# Patient Record
Sex: Male | Born: 1990 | Race: White | Hispanic: No | Marital: Single | Smoking: Current every day smoker
Health system: Southern US, Community
[De-identification: ages and names within clinical notes are randomized; demographics above are authoritative.]

## PROBLEM LIST (undated history)

## (undated) DIAGNOSIS — J45909 Unspecified asthma, uncomplicated: Secondary | ICD-10-CM

## (undated) DIAGNOSIS — M1990 Unspecified osteoarthritis, unspecified site: Secondary | ICD-10-CM

---

## 2012-10-20 ENCOUNTER — Encounter (HOSPITAL_COMMUNITY): Payer: Self-pay | Admitting: Physical Medicine and Rehabilitation

## 2012-10-20 ENCOUNTER — Emergency Department (HOSPITAL_COMMUNITY)
Admission: EM | Admit: 2012-10-20 | Discharge: 2012-10-20 | Disposition: A | Discharge status: Home / Self Care | Payer: Self-pay | Attending: Emergency Medicine | Admitting: Emergency Medicine

## 2012-10-20 DIAGNOSIS — S61209A Unspecified open wound of unspecified finger without damage to nail, initial encounter: Secondary | ICD-10-CM | POA: Insufficient documentation

## 2012-10-20 DIAGNOSIS — Y9389 Activity, other specified: Secondary | ICD-10-CM | POA: Insufficient documentation

## 2012-10-20 DIAGNOSIS — Z8739 Personal history of other diseases of the musculoskeletal system and connective tissue: Secondary | ICD-10-CM | POA: Insufficient documentation

## 2012-10-20 DIAGNOSIS — S61219A Laceration without foreign body of unspecified finger without damage to nail, initial encounter: Secondary | ICD-10-CM

## 2012-10-20 DIAGNOSIS — Y929 Unspecified place or not applicable: Secondary | ICD-10-CM | POA: Insufficient documentation

## 2012-10-20 DIAGNOSIS — W268XXA Contact with other sharp object(s), not elsewhere classified, initial encounter: Secondary | ICD-10-CM | POA: Insufficient documentation

## 2012-10-20 HISTORY — DX: Unspecified osteoarthritis, unspecified site: M19.90

## 2012-10-20 MED ORDER — TRAMADOL HCL 50 MG PO TABS: 50.0000 mg | ORAL_TABLET | Freq: Four times a day (QID) | ORAL | Status: DC | PRN

## 2012-10-20 MED ORDER — CEPHALEXIN 500 MG PO CAPS: 500.0000 mg | ORAL_CAPSULE | Freq: Two times a day (BID) | ORAL | Status: DC

## 2012-10-20 NOTE — ED Notes (Signed)
Dressing applied to finger 

## 2012-10-20 NOTE — ED Notes (Signed)
Pt presents to department for evaluation of R 1st finger laceration. States he cut finger with straight razor attempting to open cardboard box. 0.5 inch laceration noted. Bleeding controlled upon arrival. CMS intact. Pt is alert and oriented x4.

## 2012-10-20 NOTE — ED Provider Notes (Signed)
History     CSN: [id number]  Arrival date & time 10/20/12  1250   First MD Initiated Contact with Patient 10/20/12 1311      Chief Complaint  Patient presents with  . Extremity Laceration    (Consider location/radiation/quality/duration/timing/severity/associated sxs/prior treatment) HPI  Kenard Morawski is a 22 y.o. male complaining of laceration to right first digit distal phalanx on the radial side at the level of the nailbed approximately 1 cm long with bleeding controlled occurred earlier while he was using a straight razor to open box. Patient's last tetanus was within last 5 years. Pain is minimal  Past Medical History  Diagnosis Date  . Arthritis     No past surgical history on file.  History reviewed. No pertinent family history.  History  Substance Use Topics  . Smoking status: Never Smoker   . Smokeless tobacco: Not on file  . Alcohol Use: No      Review of Systems  Constitutional: Negative for fever.  Respiratory: Negative for shortness of breath.   Cardiovascular: Negative for chest pain.  Gastrointestinal: Negative for nausea, vomiting, abdominal pain and diarrhea.  All other systems reviewed and are negative.    Allergies  Zithromax  Home Medications  No current outpatient prescriptions on file.  BP 131/79  Pulse 88  Temp 97.9 F (36.6 C) (Oral)  Resp 18  SpO2 100%  Physical Exam  Nursing note and vitals reviewed. Constitutional: He is oriented to person, place, and time. He appears well-developed and well-nourished. No distress.  HENT:  Head: Normocephalic.  Eyes: Conjunctivae normal and EOM are normal.  Cardiovascular: Normal rate.   Pulmonary/Chest: Effort normal. No stridor.  Musculoskeletal: Normal range of motion.  Neurological: He is alert and oriented to person, place, and time.  Skin:       1 cm full-thickness laceration to the radial side of the right second digit just lateral to the nail bed. Bleeding controlled, no gross  contamination, distal sensation is intact and cap refill is less than 2 seconds.  Psychiatric: He has a normal mood and affect.    ED Course  Procedures (including critical care time)  LACERATION REPAIR Performed by: Wynetta Emery Authorized by: Wynetta Emery Consent: Verbal consent obtained. Risks and benefits: risks, benefits and alternatives were discussed Consent given by: patient Patient identity confirmed: Wrist band  Prepped and Draped in normal sterile fashion  Tetanus: Up to date 3 years ago  Laceration Location: Right second digit distal phalanx  Laceration Length: 1 cm cm  Anesthesia: Digital block   Local anesthetic: 2% without epinephrine  Anesthetic total: 3 ml  Irrigation method: syringe  Amount of cleaning: copious   Wound explored to depth in good light on a bloodless field with no foreign bodies seen or palpated.   Skin closure: 5-0 polypropylene   Number of sutures: 1   Technique: Simple interrupted   Patient tolerance: Patient tolerated the procedure well with no immediate complications.  Antibx ointment applied. Instructions for care discussed verbally and patient provided with additional written instructions for homecare and f/u.  Labs Reviewed - No data to display No results found.   1. Laceration of finger       MDM  Full-thickness laceration closed with one suture to prevent gaping and infection.   Filed Vitals:   10/20/12 1303  BP: 131/79  Pulse: 88  Temp: 97.9 F (36.6 C)  TempSrc: Oral  Resp: 18  SpO2: 100%     Pt verbalized understanding and  agrees with care plan. Outpatient follow-up and return precautions given.    New Prescriptions   CEPHALEXIN (KEFLEX) 500 MG CAPSULE    Take 1 capsule (500 mg total) by mouth 2 (two) times daily.   TRAMADOL (ULTRAM) 50 MG TABLET    Take 1 tablet (50 mg total) by mouth every 6 (six) hours as needed for pain.     Wynetta Emery, PA-C 10/20/12 2042

## 2012-10-23 NOTE — ED Provider Notes (Signed)
Medical screening examination/treatment/procedure(s) were performed by non-physician practitioner and as supervising physician I was immediately available for consultation/collaboration.   Gavin Pound. Anjelina Dung, MD 10/23/12 1404

## 2012-11-22 ENCOUNTER — Encounter (HOSPITAL_BASED_OUTPATIENT_CLINIC_OR_DEPARTMENT_OTHER): Payer: Self-pay | Admitting: Emergency Medicine

## 2012-11-22 ENCOUNTER — Emergency Department (HOSPITAL_BASED_OUTPATIENT_CLINIC_OR_DEPARTMENT_OTHER): Payer: No Typology Code available for payment source

## 2012-11-22 ENCOUNTER — Emergency Department (HOSPITAL_BASED_OUTPATIENT_CLINIC_OR_DEPARTMENT_OTHER)
Admission: EM | Admit: 2012-11-22 | Discharge: 2012-11-22 | Disposition: A | Discharge status: Home / Self Care | Payer: No Typology Code available for payment source | Attending: Emergency Medicine | Admitting: Emergency Medicine

## 2012-11-22 DIAGNOSIS — M542 Cervicalgia: Secondary | ICD-10-CM

## 2012-11-22 DIAGNOSIS — S0993XA Unspecified injury of face, initial encounter: Secondary | ICD-10-CM | POA: Insufficient documentation

## 2012-11-22 DIAGNOSIS — M549 Dorsalgia, unspecified: Secondary | ICD-10-CM

## 2012-11-22 DIAGNOSIS — F17200 Nicotine dependence, unspecified, uncomplicated: Secondary | ICD-10-CM | POA: Insufficient documentation

## 2012-11-22 DIAGNOSIS — IMO0002 Reserved for concepts with insufficient information to code with codable children: Secondary | ICD-10-CM | POA: Insufficient documentation

## 2012-11-22 DIAGNOSIS — Y92410 Unspecified street and highway as the place of occurrence of the external cause: Secondary | ICD-10-CM | POA: Insufficient documentation

## 2012-11-22 DIAGNOSIS — Y9389 Activity, other specified: Secondary | ICD-10-CM | POA: Insufficient documentation

## 2012-11-22 DIAGNOSIS — Z8739 Personal history of other diseases of the musculoskeletal system and connective tissue: Secondary | ICD-10-CM | POA: Insufficient documentation

## 2012-11-22 MED ORDER — IBUPROFEN 600 MG PO TABS: 600.0000 mg | ORAL_TABLET | Freq: Three times a day (TID) | ORAL | Status: DC

## 2012-11-22 MED ORDER — HYDROCODONE-ACETAMINOPHEN 5-325 MG PO TABS: 2.0000 | ORAL_TABLET | ORAL | Status: DC | PRN

## 2012-11-22 MED ORDER — HYDROCODONE-ACETAMINOPHEN 5-325 MG PO TABS
1.0000 | ORAL_TABLET | Freq: Once | ORAL | Status: AC
  Administered 2012-11-22: 1 via ORAL
  Filled 2012-11-22: qty 1

## 2012-11-22 NOTE — ED Provider Notes (Signed)
Medical screening examination/treatment/procedure(s) were performed by non-physician practitioner and as supervising physician I was immediately available for consultation/collaboration.   Melanie Belfi, MD 11/22/12 2018 

## 2012-11-22 NOTE — ED Notes (Signed)
D/c instructions reviewed w/ pt and family - pt and family deny any further questions or concerns at present. Pt ambulating independently w/ steady gait on d/c in no acute distress, A&Ox4. Rx given x2  
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
## 2012-11-22 NOTE — ED Notes (Signed)
Pt reports "I got a rash from a medicine caled, Ultr, Ultra, Ultram. Years ago from a wrestling injury." When reminded that medicine was also recently prescribed, Pt states "oh yeh, I threw the prescription away." Later patient states, "it's still in my medicine cabinet." Education provided.

## 2012-11-25 ENCOUNTER — Encounter (HOSPITAL_BASED_OUTPATIENT_CLINIC_OR_DEPARTMENT_OTHER): Payer: Self-pay | Admitting: Family Medicine

## 2012-11-25 ENCOUNTER — Emergency Department (HOSPITAL_BASED_OUTPATIENT_CLINIC_OR_DEPARTMENT_OTHER)
Admission: EM | Admit: 2012-11-25 | Discharge: 2012-11-25 | Disposition: A | Discharge status: Home / Self Care | Payer: No Typology Code available for payment source | Attending: Emergency Medicine | Admitting: Emergency Medicine

## 2012-11-25 DIAGNOSIS — Y92410 Unspecified street and highway as the place of occurrence of the external cause: Secondary | ICD-10-CM | POA: Insufficient documentation

## 2012-11-25 DIAGNOSIS — S335XXA Sprain of ligaments of lumbar spine, initial encounter: Secondary | ICD-10-CM | POA: Insufficient documentation

## 2012-11-25 DIAGNOSIS — Y9389 Activity, other specified: Secondary | ICD-10-CM | POA: Insufficient documentation

## 2012-11-25 DIAGNOSIS — S161XXD Strain of muscle, fascia and tendon at neck level, subsequent encounter: Secondary | ICD-10-CM

## 2012-11-25 DIAGNOSIS — F0781 Postconcussional syndrome: Secondary | ICD-10-CM | POA: Insufficient documentation

## 2012-11-25 DIAGNOSIS — Z8739 Personal history of other diseases of the musculoskeletal system and connective tissue: Secondary | ICD-10-CM | POA: Insufficient documentation

## 2012-11-25 DIAGNOSIS — S0990XA Unspecified injury of head, initial encounter: Secondary | ICD-10-CM | POA: Insufficient documentation

## 2012-11-25 DIAGNOSIS — S39012D Strain of muscle, fascia and tendon of lower back, subsequent encounter: Secondary | ICD-10-CM

## 2012-11-25 DIAGNOSIS — S139XXA Sprain of joints and ligaments of unspecified parts of neck, initial encounter: Secondary | ICD-10-CM | POA: Insufficient documentation

## 2012-11-25 DIAGNOSIS — F17200 Nicotine dependence, unspecified, uncomplicated: Secondary | ICD-10-CM | POA: Insufficient documentation

## 2012-11-25 MED ORDER — OXYCODONE-ACETAMINOPHEN 5-325 MG PO TABS: 2.0000 | ORAL_TABLET | Freq: Four times a day (QID) | ORAL | Status: DC | PRN
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
## 2013-05-19 ENCOUNTER — Encounter (HOSPITAL_COMMUNITY): Payer: Self-pay | Admitting: Emergency Medicine

## 2013-05-19 ENCOUNTER — Emergency Department (HOSPITAL_COMMUNITY)
Admission: EM | Admit: 2013-05-19 | Discharge: 2013-05-20 | Disposition: A | Discharge status: Home / Self Care | Payer: BC Managed Care – PPO | Attending: Emergency Medicine | Admitting: Emergency Medicine

## 2013-05-19 DIAGNOSIS — R109 Unspecified abdominal pain: Secondary | ICD-10-CM | POA: Insufficient documentation

## 2013-05-19 DIAGNOSIS — R112 Nausea with vomiting, unspecified: Secondary | ICD-10-CM | POA: Insufficient documentation

## 2013-05-19 DIAGNOSIS — M129 Arthropathy, unspecified: Secondary | ICD-10-CM | POA: Insufficient documentation

## 2013-05-19 DIAGNOSIS — R1032 Left lower quadrant pain: Secondary | ICD-10-CM | POA: Insufficient documentation

## 2013-05-19 DIAGNOSIS — R1031 Right lower quadrant pain: Secondary | ICD-10-CM | POA: Insufficient documentation

## 2013-05-19 DIAGNOSIS — R197 Diarrhea, unspecified: Secondary | ICD-10-CM | POA: Insufficient documentation

## 2013-05-19 HISTORY — DX: Unspecified asthma, uncomplicated: J45.909

## 2013-05-19 LAB — CBC WITH DIFFERENTIAL/PLATELET
Eosinophils Absolute: 0.3 10*3/uL (ref 0.0–0.7)
Eosinophils Relative: 6 % — ABNORMAL HIGH (ref 0–5)
HCT: 44.8 % (ref 39.0–52.0)
Lymphocytes Relative: 30 % (ref 12–46)
Lymphs Abs: 1.7 10*3/uL (ref 0.7–4.0)
MCH: 30.6 pg (ref 26.0–34.0)
MCV: 87.3 fL (ref 78.0–100.0)
Monocytes Absolute: 0.7 10*3/uL (ref 0.1–1.0)
Platelets: 265 10*3/uL (ref 150–400)
RBC: 5.13 MIL/uL (ref 4.22–5.81)
WBC: 5.8 10*3/uL (ref 4.0–10.5)

## 2013-05-19 LAB — COMPREHENSIVE METABOLIC PANEL
AST: 13 U/L (ref 0–37)
BUN: 8 mg/dL (ref 6–23)
CO2: 31 mEq/L (ref 19–32)
Calcium: 9.4 mg/dL (ref 8.4–10.5)
Chloride: 101 mEq/L (ref 96–112)
Creatinine, Ser: 0.8 mg/dL (ref 0.50–1.35)
GFR calc Af Amer: >90 mL/min (ref >90)
GFR calc non Af Amer: >90 mL/min (ref >90)
Glucose, Bld: 77 mg/dL (ref 70–99)
Total Bilirubin: 0.3 mg/dL (ref 0.3–1.2)

## 2013-05-19 NOTE — ED Notes (Signed)
Pt arrived to ED with a complaint of abdominal pain.  Pt states that the pain has progressively getting worse over the last two weeks.  Pt states that it got worse last night and today.  Pt's significant other states that last night he had coffee ground emesis.

## 2013-05-20 ENCOUNTER — Encounter (HOSPITAL_COMMUNITY): Payer: Self-pay | Admitting: Radiology

## 2013-05-20 ENCOUNTER — Emergency Department (HOSPITAL_COMMUNITY): Payer: BC Managed Care – PPO

## 2013-05-20 LAB — URINALYSIS, ROUTINE W REFLEX MICROSCOPIC
Hgb urine dipstick: NEGATIVE
Nitrite: NEGATIVE
Protein, ur: NEGATIVE mg/dL
Specific Gravity, Urine: 1.006 (ref 1.005–1.030)
Urobilinogen, UA: 0.2 mg/dL (ref 0.0–1.0)

## 2013-05-20 MED ORDER — IOHEXOL 300 MG/ML  SOLN
50.0000 mL | Freq: Once | INTRAMUSCULAR | Status: AC | PRN
  Administered 2013-05-20: 50 mL via ORAL

## 2013-05-20 MED ORDER — HYDROMORPHONE HCL PF 1 MG/ML IJ SOLN
1.0000 mg | Freq: Once | INTRAMUSCULAR | Status: AC
  Administered 2013-05-20: 1 mg via INTRAVENOUS
  Filled 2013-05-20: qty 1

## 2013-05-20 MED ORDER — OXYCODONE-ACETAMINOPHEN 5-325 MG PO TABS: 1.0000 | ORAL_TABLET | Freq: Four times a day (QID) | ORAL | Status: DC | PRN

## 2013-05-20 MED ORDER — SODIUM CHLORIDE 0.9 % IV BOLUS (SEPSIS)
1000.0000 mL | Freq: Once | INTRAVENOUS | Status: AC
  Administered 2013-05-20: 1000 mL via INTRAVENOUS

## 2013-05-20 MED ORDER — ONDANSETRON HCL 4 MG/2ML IJ SOLN
4.0000 mg | Freq: Once | INTRAMUSCULAR | Status: AC
  Administered 2013-05-20: 4 mg via INTRAVENOUS
  Filled 2013-05-20: qty 2

## 2013-05-20 MED ORDER — IOHEXOL 300 MG/ML  SOLN
100.0000 mL | Freq: Once | INTRAMUSCULAR | Status: AC | PRN
  Administered 2013-05-20: 100 mL via INTRAVENOUS

## 2013-05-20 MED ORDER — ONDANSETRON 4 MG PO TBDP: 4.0000 mg | ORAL_TABLET | Freq: Three times a day (TID) | ORAL | Status: DC | PRN

## 2013-05-20 NOTE — ED Provider Notes (Signed)
CSN: [id number]     Arrival date & time 05/19/13  2049 History   First MD Initiated Contact with Patient 05/20/13 0047     Chief Complaint  Patient presents with  . Abdominal Pain   (Consider location/radiation/quality/duration/timing/severity/associated sxs/prior Treatment) HPI Comments: Patient presents with a chief complaint of abdominal pain.  He reports that the pain is located across his lower abdomen and has been present for the past two days and is gradually worsening.  Pain does not radiate.  He reports that he has not taken anything for the pain prior to arrival.  He states that he has never had pain like this before.  He has also had nausea, vomiting, and diarrhea over the past 2 days.  Denies any blood in his emesis or blood in his stool.  He reported to the RN coffee ground emesis, however, when questioned about this further patient states that his emesis looked greenish in color and was "slimy"  He denies fever or chills.  Denies urinary symptoms.  Denies testicular pain or scrotal swelling.  Denies penile discharge.    The history is provided by the patient.    Past Medical History  Diagnosis Date  . Arthritis    History reviewed. No pertinent past surgical history. History reviewed. No pertinent family history. History  Substance Use Topics  . Smoking status: Current Every Day Smoker  . Smokeless tobacco: Not on file  . Alcohol Use: No    Review of Systems  Gastrointestinal: Positive for nausea, vomiting and abdominal pain.  All other systems reviewed and are negative.    Allergies  Ultram and Zithromax  Home Medications   Current Outpatient Rx  Name  Route  Sig  Dispense  Refill  . bismuth subsalicylate (PEPTO BISMOL) 262 MG/15ML suspension   Oral   Take 30 mLs by mouth every 6 (six) hours as needed for indigestion (stomach issues).         Marland Kitchen oxyCODONE-acetaminophen (PERCOCET) 5-325 MG per tablet   Oral   Take 2 tablets by mouth every 6 (six) hours as  needed for pain.   20 tablet   0   . ibuprofen (ADVIL,MOTRIN) 600 MG tablet   Oral   Take 1 tablet (600 mg total) by mouth 3 (three) times daily.   21 tablet   0    BP 123/68  Pulse 72  Temp(Src) 98.9 F (37.2 C) (Oral)  Resp 20  Wt 125 lb (56.7 kg)  BMI 17.94 kg/m2  SpO2 99% Physical Exam  Nursing note and vitals reviewed. Constitutional: He appears well-developed and well-nourished.  HENT:  Head: Normocephalic and atraumatic.  Mouth/Throat: Oropharynx is clear and moist.  Neck: Normal range of motion. Neck supple.  Cardiovascular: Normal rate, regular rhythm and normal heart sounds.   Pulmonary/Chest: Effort normal and breath sounds normal.  Abdominal: Soft. Normal appearance and bowel sounds are normal. He exhibits no distension and no mass. There is tenderness in the right lower quadrant, suprapubic area and left lower quadrant. There is no rebound and no guarding.  Neurological: He is alert.  Skin: Skin is warm and dry.  Psychiatric: He has a normal mood and affect.    ED Course  Procedures (including critical care time) Labs Review Labs Reviewed  CBC WITH DIFFERENTIAL - Abnormal; Notable for the following:    Eosinophils Relative 6 (*)    All other components within normal limits  LIPASE, BLOOD - Abnormal; Notable for the following:    Lipase 100 (*)  All other components within normal limits  COMPREHENSIVE METABOLIC PANEL  URINALYSIS, ROUTINE W REFLEX MICROSCOPIC   Imaging Review Ct Abdomen Pelvis W Contrast  05/20/2013   *RADIOLOGY REPORT*  Clinical Data:  Abdominal pain.  CT ABDOMEN AND PELVIS WITH CONTRAST  Technique:  Multidetector CT imaging of the abdomen and pelvis was performed following the standard protocol during bolus administration of intravenous contrast.  Contrast: OMNIPAQUE IOHEXOL 300 MG/ML  SOLN  Comparison: None.  Findings:  BODY WALL: Unremarkable.  LOWER CHEST:  Mediastinum: Unremarkable.  Lungs/pleura: Mild air trapping at the  bases.  ABDOMEN/PELVIS:  Liver: Mild periportal edema, usually related to volume resuscitation.  Biliary: No evidence of biliary obstruction or stone.  Pancreas: Unremarkable.  Spleen: Unremarkable.  Adrenals: Unremarkable.  Kidneys and ureters: Prominent proximal left urinary collecting system likely extrarenal pelvis.  There may be an element of mild UPJ stenosis.  Bladder: Unremarkable.  Bowel: Distension of proximal small bowel loops is likely physiologic given these of the loops contain enteric contrast.  No obstruction. Normal appendix.  Retroperitoneum: No mass or adenopathy.  Peritoneum: No free fluid or gas.  Reproductive: Unremarkable.  Vascular: No acute abnormality.  OSSEOUS: No acute abnormalities. No suspicious lytic or blastic lesions.  IMPRESSION:  1.  Mild periportal edema, usually related to volume resuscitation. Correlate with liver function test to exclude hepatitis. 2.  Normal appendix.   Original Report Authenticated By: Tiburcio Pea    MDM  No diagnosis found. Patient presenting with abdominal pain.  Labs unremarkable aside from elevated lipase of 100.  However, patient is not having any upper abdominal pain.  Pancreas unremarkable on CT.  Patient reports last alcoholic drink was eight months ago.  Therefore, does not correlate with Acute Pancreatitis.  Patient with negative Murphy's sign on exam.  LFT's WNL.  No evidence of biliary obstruction or stone on CT.  Abdominal/Pelvis CT unremarkable.  Therefore, feel that the patient is stable for discharge.  Return precautions discussed.     Pascal Lux [place], PA-C 05/21/13 [phone]

## 2013-05-21 NOTE — ED Provider Notes (Signed)
Medical screening examination/treatment/procedure(s) were performed by non-physician practitioner and as supervising physician I was immediately available for consultation/collaboration.  Shon Baton, MD 05/21/13 [phone]

## 2013-11-24 ENCOUNTER — Emergency Department (HOSPITAL_BASED_OUTPATIENT_CLINIC_OR_DEPARTMENT_OTHER)
Admission: EM | Admit: 2013-11-24 | Discharge: 2013-11-24 | Disposition: A | Discharge status: Home / Self Care | Payer: BC Managed Care – PPO | Attending: Emergency Medicine | Admitting: Emergency Medicine

## 2013-11-24 ENCOUNTER — Encounter (HOSPITAL_BASED_OUTPATIENT_CLINIC_OR_DEPARTMENT_OTHER): Payer: Self-pay | Admitting: Emergency Medicine

## 2013-11-24 ENCOUNTER — Emergency Department (HOSPITAL_BASED_OUTPATIENT_CLINIC_OR_DEPARTMENT_OTHER): Payer: BC Managed Care – PPO

## 2013-11-24 DIAGNOSIS — Y9289 Other specified places as the place of occurrence of the external cause: Secondary | ICD-10-CM | POA: Insufficient documentation

## 2013-11-24 DIAGNOSIS — J45909 Unspecified asthma, uncomplicated: Secondary | ICD-10-CM | POA: Insufficient documentation

## 2013-11-24 DIAGNOSIS — S0993XA Unspecified injury of face, initial encounter: Secondary | ICD-10-CM | POA: Insufficient documentation

## 2013-11-24 DIAGNOSIS — S199XXA Unspecified injury of neck, initial encounter: Principal | ICD-10-CM

## 2013-11-24 DIAGNOSIS — Y9389 Activity, other specified: Secondary | ICD-10-CM | POA: Insufficient documentation

## 2013-11-24 DIAGNOSIS — M129 Arthropathy, unspecified: Secondary | ICD-10-CM | POA: Insufficient documentation

## 2013-11-24 DIAGNOSIS — R5381 Other malaise: Secondary | ICD-10-CM | POA: Insufficient documentation

## 2013-11-24 DIAGNOSIS — R5383 Other fatigue: Secondary | ICD-10-CM

## 2013-11-24 DIAGNOSIS — M62838 Other muscle spasm: Secondary | ICD-10-CM

## 2013-11-24 DIAGNOSIS — Z79899 Other long term (current) drug therapy: Secondary | ICD-10-CM | POA: Insufficient documentation

## 2013-11-24 DIAGNOSIS — M542 Cervicalgia: Secondary | ICD-10-CM

## 2013-11-24 DIAGNOSIS — W208XXA Other cause of strike by thrown, projected or falling object, initial encounter: Secondary | ICD-10-CM | POA: Insufficient documentation

## 2013-11-24 DIAGNOSIS — F17200 Nicotine dependence, unspecified, uncomplicated: Secondary | ICD-10-CM | POA: Insufficient documentation

## 2013-11-24 MED ORDER — DIAZEPAM 5 MG PO TABS
5.0000 mg | ORAL_TABLET | Freq: Once | ORAL | Status: AC
  Administered 2013-11-24: 5 mg via ORAL

## 2013-11-24 MED ORDER — DIAZEPAM 5 MG PO TABS: 5.0000 mg | ORAL_TABLET | Freq: Three times a day (TID) | ORAL | Status: DC | PRN

## 2013-11-24 MED ORDER — OXYCODONE-ACETAMINOPHEN 5-325 MG PO TABS
2.0000 | ORAL_TABLET | Freq: Once | ORAL | Status: AC
  Administered 2013-11-24: 2 via ORAL
  Filled 2013-11-24: qty 2

## 2013-11-24 MED ORDER — OXYCODONE-ACETAMINOPHEN 5-325 MG PO TABS: 1.0000 | ORAL_TABLET | ORAL | Status: DC | PRN

## 2013-11-24 MED ORDER — MORPHINE SULFATE 4 MG/ML IJ SOLN: 4.0000 mg | Freq: Once | INTRAMUSCULAR | Status: DC

## 2013-11-24 MED ORDER — IBUPROFEN 800 MG PO TABS: 800.0000 mg | ORAL_TABLET | Freq: Three times a day (TID) | ORAL | Status: DC | PRN
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
## 2013-11-24 NOTE — ED Notes (Signed)
Pt complains of neck pain x 1 week after was cutting down a tree and a branch fell and hit him in the back of the neck.  Pt reports numbness tingling to legs, hard time looking up and left.

## 2013-11-24 NOTE — Discharge Instructions (Signed)
Cervical Sprain °A cervical sprain is an injury in the neck in which the strong, fibrous tissues (ligaments) that connect your neck bones stretch or tear. Cervical sprains can range from mild to severe. Severe cervical sprains can cause the neck vertebrae to be unstable. This can lead to damage of the spinal cord and can result in serious nervous system problems. The amount of time it takes for a cervical sprain to get better depends on the cause and extent of the injury. Most cervical sprains heal in 1 to 3 weeks. °CAUSES  °Severe cervical sprains may be caused by:  °· Contact sport injuries (such as from football, rugby, wrestling, hockey, auto racing, gymnastics, diving, martial arts, or boxing).   °· Motor vehicle collisions.   °· Whiplash injuries. This is an injury from a sudden forward-and backward whipping movement of the head and neck.  °· Falls.   °Mild cervical sprains may be caused by:  °· Being in an awkward position, such as while cradling a telephone between your ear and shoulder.   °· Sitting in a chair that does not offer proper support.   °· Working at a poorly designed computer station.   °· Looking up or down for long periods of time.   °SYMPTOMS  °· Pain, soreness, stiffness, or a burning sensation in the front, back, or sides of the neck. This discomfort may develop immediately after the injury or slowly, 24 hours or more after the injury.   °· Pain or tenderness directly in the middle of the back of the neck.   °· Shoulder or upper back pain.   °· Limited ability to move the neck.   °· Headache.   °· Dizziness.   °· Weakness, numbness, or tingling in the hands or arms.   °· Muscle spasms.   °· Difficulty swallowing or chewing.   °· Tenderness and swelling of the neck.   °DIAGNOSIS  °Most of the time your health care provider can diagnose a cervical sprain by taking your history and doing a physical exam. Your health care provider will ask about previous neck injuries and any known neck  problems, such as arthritis in the neck. X-rays may be taken to find out if there are any other problems, such as with the bones of the neck. Other tests, such as a CT scan or MRI, may also be needed.  °TREATMENT  °Treatment depends on the severity of the cervical sprain. Mild sprains can be treated with rest, keeping the neck in place (immobilization), and pain medicines. Severe cervical sprains are immediately immobilized. Further treatment is done to help with pain, muscle spasms, and other symptoms and may include: °· Medicines, such as pain relievers, numbing medicines, or muscle relaxants.   °· Physical therapy. This may involve stretching exercises, strengthening exercises, and posture training. Exercises and improved posture can help stabilize the neck, strengthen muscles, and help stop symptoms from returning.   °HOME CARE INSTRUCTIONS  °· Put ice on the injured area.   °· Put ice in a plastic bag.   °· Place a towel between your skin and the bag.   °· Leave the ice on for 15 20 minutes, 3 4 times a day.   °· If your injury was severe, you may have been given a cervical collar to wear. A cervical collar is a two-piece collar designed to keep your neck from moving while it heals. °· Do not remove the collar unless instructed by your health care provider. °· If you have long hair, keep it outside of the collar. °· Ask your health care provider before making any adjustments to your collar.   Minor adjustments may be required over time to improve comfort and reduce pressure on your chin or on the back of your head.  Ifyou are allowed to remove the collar for cleaning or bathing, follow your health care provider's instructions on how to do so safely.  Keep your collar clean by wiping it with mild soap and water and drying it completely. If the collar you have been given includes removable pads, remove them every 1 2 days and hand wash them with soap and water. Allow them to air dry. They should be completely  dry before you wear them in the collar.  If you are allowed to remove the collar for cleaning and bathing, wash and dry the skin of your neck. Check your skin for irritation or sores. If you see any, tell your health care provider.  Do not drive while wearing the collar.   Only take over-the-counter or prescription medicines for pain, discomfort, or fever as directed by your health care provider.   Keep all follow-up appointments as directed by your health care provider.   Keep all physical therapy appointments as directed by your health care provider.   Make any needed adjustments to your workstation to promote good posture.   Avoid positions and activities that make your symptoms worse.   Warm up and stretch before being active to help prevent problems.  SEEK MEDICAL CARE IF:   Your pain is not controlled with medicine.   You are unable to decrease your pain medicine over time as planned.   Your activity level is not improving as expected.  SEEK IMMEDIATE MEDICAL CARE IF:   You develop any bleeding.  You develop stomach upset.  You have signs of an allergic reaction to your medicine.   Your symptoms get worse.   You develop new, unexplained symptoms.   You have numbness, tingling, weakness, or paralysis in any part of your body.  MAKE SURE YOU:   Understand these instructions.  Will watch your condition.  Will get help right away if you are not doing well or get worse. Document Released: 07/02/2007 Document Revised: 06/25/2013 Document Reviewed: 03/12/2013 [facility] Patient Information 2014 [place], [place].  Muscle Cramps and Spasms Muscle cramps and spasms occur when a muscle or muscles tighten and you have no control over this tightening (involuntary muscle contraction). They are a common problem and can develop in any muscle. The most common place is in the calf muscles of the leg. Both muscle cramps and muscle spasms are involuntary muscle  contractions, but they also have differences:   Muscle cramps are sporadic and painful. They may last a few seconds to a quarter of an hour. Muscle cramps are often more forceful and last longer than muscle spasms.  Muscle spasms may or may not be painful. They may also last just a few seconds or much longer. CAUSES  It is uncommon for cramps or spasms to be due to a serious underlying problem. In many cases, the cause of cramps or spasms is unknown. Some common causes are:   Overexertion.   Overuse from repetitive motions (doing the same thing over and over).   Remaining in a certain position for a long period of time.   Improper preparation, form, or technique while performing a sport or activity.   Dehydration.   Injury.   Side effects of some medicines.   Abnormally low levels of the salts and ions in your blood (electrolytes), especially potassium and calcium. This could happen if  you are taking water pills (diuretics) or you are pregnant.  Some underlying medical problems can make it more likely to develop cramps or spasms. These include, but are not limited to:   Diabetes.   Parkinson disease.   Hormone disorders, such as thyroid problems.   Alcohol abuse.   Diseases specific to muscles, joints, and bones.   Blood vessel disease where not enough blood is getting to the muscles.  HOME CARE INSTRUCTIONS   Stay well hydrated. Drink enough water and fluids to keep your urine clear or pale yellow.  It may be helpful to massage, stretch, and relax the affected muscle.  For tight or tense muscles, use a warm towel, heating pad, or hot shower water directed to the affected area.  If you are sore or have pain after a cramp or spasm, applying ice to the affected area may relieve discomfort.  Put ice in a plastic bag.  Place a towel between your skin and the bag.  Leave the ice on for 15-20 minutes, 03-04 times a day.  Medicines used to treat a known  cause of cramps or spasms may help reduce their frequency or severity. Only take over-the-counter or prescription medicines as directed by your caregiver. SEEK MEDICAL CARE IF:  Your cramps or spasms get more severe, more frequent, or do not improve over time.  MAKE SURE YOU:   Understand these instructions.  Will watch your condition.  Will get help right away if you are not doing well or get worse. Document Released: 02/24/2002 Document Revised: 12/30/2012 Document Reviewed: 08/21/2012 [facility] Patient Information 2014 [place], [place].

## 2013-12-19 ENCOUNTER — Encounter (HOSPITAL_BASED_OUTPATIENT_CLINIC_OR_DEPARTMENT_OTHER): Payer: Self-pay | Admitting: Emergency Medicine

## 2013-12-19 ENCOUNTER — Emergency Department (HOSPITAL_BASED_OUTPATIENT_CLINIC_OR_DEPARTMENT_OTHER)
Admission: EM | Admit: 2013-12-19 | Discharge: 2013-12-19 | Disposition: A | Discharge status: Home / Self Care | Payer: BC Managed Care – PPO | Attending: Emergency Medicine | Admitting: Emergency Medicine

## 2013-12-19 DIAGNOSIS — Y929 Unspecified place or not applicable: Secondary | ICD-10-CM | POA: Insufficient documentation

## 2013-12-19 DIAGNOSIS — F17200 Nicotine dependence, unspecified, uncomplicated: Secondary | ICD-10-CM | POA: Insufficient documentation

## 2013-12-19 DIAGNOSIS — M542 Cervicalgia: Secondary | ICD-10-CM

## 2013-12-19 DIAGNOSIS — S199XXA Unspecified injury of neck, initial encounter: Principal | ICD-10-CM

## 2013-12-19 DIAGNOSIS — IMO0002 Reserved for concepts with insufficient information to code with codable children: Secondary | ICD-10-CM | POA: Insufficient documentation

## 2013-12-19 DIAGNOSIS — J45909 Unspecified asthma, uncomplicated: Secondary | ICD-10-CM | POA: Insufficient documentation

## 2013-12-19 DIAGNOSIS — Z79899 Other long term (current) drug therapy: Secondary | ICD-10-CM | POA: Insufficient documentation

## 2013-12-19 DIAGNOSIS — M129 Arthropathy, unspecified: Secondary | ICD-10-CM | POA: Insufficient documentation

## 2013-12-19 DIAGNOSIS — S0993XA Unspecified injury of face, initial encounter: Secondary | ICD-10-CM | POA: Insufficient documentation

## 2013-12-19 DIAGNOSIS — Z791 Long term (current) use of non-steroidal anti-inflammatories (NSAID): Secondary | ICD-10-CM | POA: Insufficient documentation

## 2013-12-19 DIAGNOSIS — Y9389 Activity, other specified: Secondary | ICD-10-CM | POA: Insufficient documentation

## 2013-12-19 MED ORDER — METHOCARBAMOL 500 MG PO TABS: 500.0000 mg | ORAL_TABLET | Freq: Two times a day (BID) | ORAL | Status: DC

## 2013-12-19 MED ORDER — PREDNISONE 10 MG PO TABS: ORAL_TABLET | ORAL | Status: DC

## 2013-12-19 MED ORDER — HYDROCODONE-ACETAMINOPHEN 5-325 MG PO TABS: 2.0000 | ORAL_TABLET | ORAL | Status: DC | PRN

## 2013-12-19 NOTE — ED Provider Notes (Signed)
CSN: [id number]     Arrival date & time 12/19/13  1517 History   First MD Initiated Contact with Patient 12/19/13 1800     Chief Complaint  Patient presents with  . Neck Pain     (Consider location/radiation/quality/duration/timing/severity/associated sxs/prior Treatment) Patient is a 23 y.o. male presenting with neck pain. The history is provided by the patient. No language interpreter was used.  Neck Pain Pain location:  Generalized neck Quality:  Aching and cramping Pain severity:  Severe Duration:  1 month Timing:  Constant Chronicity:  New Relieved by:  Nothing Worsened by:  Nothing tried  Pt has neck pain since being hit in the neck with a limb Past Medical History  Diagnosis Date  . Arthritis   . Asthma     seasonal--inhaler prn   History reviewed. No pertinent past surgical history. No family history on file. History  Substance Use Topics  . Smoking status: Current Every Day Smoker -- 1.00 packs/day    Types: Cigarettes  . Smokeless tobacco: Not on file  . Alcohol Use: No    Review of Systems  Musculoskeletal: Positive for neck pain.  All other systems reviewed and are negative.      Allergies  Ultram and Zithromax  Home Medications   Current Outpatient Rx  Name  Route  Sig  Dispense  Refill  . bismuth subsalicylate (PEPTO BISMOL) 262 MG/15ML suspension   Oral   Take 30 mLs by mouth every 6 (six) hours as needed for indigestion (stomach issues).         . diazepam (VALIUM) 5 MG tablet   Oral   Take 1 tablet (5 mg total) by mouth every 8 (eight) hours as needed for muscle spasms.   10 tablet   0   . HYDROcodone-acetaminophen (NORCO/VICODIN) 5-325 MG per tablet   Oral   Take 2 tablets by mouth every 4 (four) hours as needed.   20 tablet   0   . ibuprofen (ADVIL,MOTRIN) 600 MG tablet   Oral   Take 1 tablet (600 mg total) by mouth 3 (three) times daily.   21 tablet   0   . ibuprofen (ADVIL,MOTRIN) 800 MG tablet   Oral   Take 1 tablet  (800 mg total) by mouth every 8 (eight) hours as needed for mild pain.   30 tablet   0   . methocarbamol (ROBAXIN) 500 MG tablet   Oral   Take 1 tablet (500 mg total) by mouth 2 (two) times daily.   20 tablet   0   . ondansetron (ZOFRAN ODT) 4 MG disintegrating tablet   Oral   Take 1 tablet (4 mg total) by mouth every 8 (eight) hours as needed for nausea.   20 tablet   0   . oxyCODONE-acetaminophen (PERCOCET) 5-325 MG per tablet   Oral   Take 2 tablets by mouth every 6 (six) hours as needed for pain.   20 tablet   0   . oxyCODONE-acetaminophen (PERCOCET/ROXICET) 5-325 MG per tablet   Oral   Take 1-2 tablets by mouth every 6 (six) hours as needed for pain.   20 tablet   0   . oxyCODONE-acetaminophen (PERCOCET/ROXICET) 5-325 MG per tablet   Oral   Take 1 tablet by mouth every 4 (four) hours as needed.   15 tablet   0   . predniSONE (DELTASONE) 10 MG tablet      6,5,4,3,2,1 taper   21 tablet   0  BP 137/87  Pulse 91  Temp(Src) 98.1 F (36.7 C) (Oral)  Resp 16  Ht 5\' 10"  (1.778 m)  Wt 130 lb (58.968 kg)  BMI 18.65 kg/m2  SpO2 98% Physical Exam  Nursing note and vitals reviewed. Constitutional: He is oriented to person, place, and time. He appears well-developed and well-nourished.  HENT:  Head: Normocephalic and atraumatic.  Right Ear: External ear normal.  Left Ear: External ear normal.  Eyes: Conjunctivae and EOM are normal. Pupils are equal, round, and reactive to light.  Neck: Normal range of motion.  Cardiovascular: Normal rate.   Pulmonary/Chest: Effort normal.  Abdominal: He exhibits no distension.  Musculoskeletal:  diffusley tender c spine  Neurological: He is alert and oriented to person, place, and time.  Skin: Skin is warm.  Psychiatric: He has a normal mood and affect.    ED Course  Procedures (including critical care time) Labs Review Labs Reviewed - No data to display Imaging Review No results found.   EKG Interpretation None       MDM   Final diagnoses:  Neck pain    Robaxin Prednisone taper Hydrocodone Follow up with Dr. Pearletha ForgeHudnall for recheck   Elson AreasLeslie K Ventura Leggitt, PA-C 12/19/13 Rickey [id number]

## 2013-12-19 NOTE — ED Provider Notes (Signed)
Medical screening examination/treatment/procedure(s) were performed by non-physician practitioner and as supervising physician I was immediately available for consultation/collaboration.   EKG Interpretation None        Shelda JakesScott W. Tal Neer, MD 12/19/13 [phone]

## 2013-12-19 NOTE — Discharge Instructions (Signed)
Cervical Sprain A cervical sprain is an injury in the neck in which the strong, fibrous tissues (ligaments) that connect your neck bones stretch or tear. Cervical sprains can range from mild to severe. Severe cervical sprains can cause the neck vertebrae to be unstable. This can lead to damage of the spinal cord and can result in serious nervous system problems. The amount of time it takes for a cervical sprain to get better depends on the cause and extent of the injury. Most cervical sprains heal in 1 to 3 weeks. CAUSES  Severe cervical sprains may be caused by:   Contact sport injuries (such as from football, rugby, wrestling, hockey, auto racing, gymnastics, diving, martial arts, or boxing).   Motor vehicle collisions.   Whiplash injuries. This is an injury from a sudden forward-and backward whipping movement of the head and neck.  Falls.  Mild cervical sprains may be caused by:   Being in an awkward position, such as while cradling a telephone between your ear and shoulder.   Sitting in a chair that does not offer proper support.   Working at a poorly designed computer station.   Looking up or down for long periods of time.  SYMPTOMS   Pain, soreness, stiffness, or a burning sensation in the front, back, or sides of the neck. This discomfort may develop immediately after the injury or slowly, 24 hours or more after the injury.   Pain or tenderness directly in the middle of the back of the neck.   Shoulder or upper back pain.   Limited ability to move the neck.   Headache.   Dizziness.   Weakness, numbness, or tingling in the hands or arms.   Muscle spasms.   Difficulty swallowing or chewing.   Tenderness and swelling of the neck.  DIAGNOSIS  Most of the time your health care provider can diagnose a cervical sprain by taking your history and doing a physical exam. Your health care provider will ask about previous neck injuries and any known neck  problems, such as arthritis in the neck. X-rays may be taken to find out if there are any other problems, such as with the bones of the neck. Other tests, such as a CT scan or MRI, may also be needed.  TREATMENT  Treatment depends on the severity of the cervical sprain. Mild sprains can be treated with rest, keeping the neck in place (immobilization), and pain medicines. Severe cervical sprains are immediately immobilized. Further treatment is done to help with pain, muscle spasms, and other symptoms and may include:  Medicines, such as pain relievers, numbing medicines, or muscle relaxants.   Physical therapy. This may involve stretching exercises, strengthening exercises, and posture training. Exercises and improved posture can help stabilize the neck, strengthen muscles, and help stop symptoms from returning.  HOME CARE INSTRUCTIONS   Put ice on the injured area.   Put ice in a plastic bag.   Place a towel between your skin and the bag.   Leave the ice on for 15 20 minutes, 3 4 times a day.   If your injury was severe, you may have been given a cervical collar to wear. A cervical collar is a two-piece collar designed to keep your neck from moving while it heals.  Do not remove the collar unless instructed by your health care provider.  If you have long hair, keep it outside of the collar.  Ask your health care provider before making any adjustments to your collar.   Minor adjustments may be required over time to improve comfort and reduce pressure on your chin or on the back of your head.  Ifyou are allowed to remove the collar for cleaning or bathing, follow your health care provider's instructions on how to do so safely.  Keep your collar clean by wiping it with mild soap and water and drying it completely. If the collar you have been given includes removable pads, remove them every 1 2 days and hand wash them with soap and water. Allow them to air dry. They should be completely  dry before you wear them in the collar.  If you are allowed to remove the collar for cleaning and bathing, wash and dry the skin of your neck. Check your skin for irritation or sores. If you see any, tell your health care provider.  Do not drive while wearing the collar.   Only take over-the-counter or prescription medicines for pain, discomfort, or fever as directed by your health care provider.   Keep all follow-up appointments as directed by your health care provider.   Keep all physical therapy appointments as directed by your health care provider.   Make any needed adjustments to your workstation to promote good posture.   Avoid positions and activities that make your symptoms worse.   Warm up and stretch before being active to help prevent problems.  SEEK MEDICAL CARE IF:   Your pain is not controlled with medicine.   You are unable to decrease your pain medicine over time as planned.   Your activity level is not improving as expected.  SEEK IMMEDIATE MEDICAL CARE IF:   You develop any bleeding.  You develop stomach upset.  You have signs of an allergic reaction to your medicine.   Your symptoms get worse.   You develop new, unexplained symptoms.   You have numbness, tingling, weakness, or paralysis in any part of your body.  MAKE SURE YOU:   Understand these instructions.  Will watch your condition.  Will get help right away if you are not doing well or get worse. Document Released: 07/02/2007 Document Revised: 06/25/2013 Document Reviewed: 03/12/2013 [employer] Patient Information 2014 [employer].  

## 2013-12-19 NOTE — ED Notes (Signed)
Pt ambulatory to restroom no difficulty.  

## 2013-12-19 NOTE — ED Notes (Signed)
PA at bedside.

## 2013-12-19 NOTE — ED Notes (Signed)
C/o neck pain-denies injury

## 2013-12-22 ENCOUNTER — Encounter: Payer: Self-pay | Admitting: Family Medicine

## 2013-12-22 ENCOUNTER — Ambulatory Visit (INDEPENDENT_AMBULATORY_CARE_PROVIDER_SITE_OTHER): Payer: BC Managed Care – PPO | Admitting: Family Medicine

## 2013-12-22 VITALS — BP 142/87 | HR 103 | Ht 70.0 in | Wt 135.0 lb

## 2013-12-22 DIAGNOSIS — S199XXA Unspecified injury of neck, initial encounter: Secondary | ICD-10-CM

## 2013-12-22 DIAGNOSIS — M542 Cervicalgia: Secondary | ICD-10-CM

## 2013-12-22 DIAGNOSIS — S0993XA Unspecified injury of face, initial encounter: Secondary | ICD-10-CM

## 2013-12-22 MED ORDER — OXYCODONE-ACETAMINOPHEN 5-325 MG PO TABS: 1.0000 | ORAL_TABLET | Freq: Four times a day (QID) | ORAL | Status: AC | PRN

## 2013-12-22 MED ORDER — METHOCARBAMOL 500 MG PO TABS: 500.0000 mg | ORAL_TABLET | Freq: Two times a day (BID) | ORAL | Status: AC

## 2013-12-22 NOTE — Patient Instructions (Signed)
You have a severe cervical strain and contusion, possible disc bulge. Ibuprofen 800mg  three times a day with food for pain and inflammation. Robaxin three times a day as needed for muscle spasms (can make you sleepy - if so do not drive while taking this). Percocet for severe pain (no driving on this medicine). Consider cervical collar if severely painful. Simple range of motion exercises within limits of pain to prevent further stiffness. Start physical therapy for stretching, exercises, traction, and modalities. Heat 15 minutes at a time 3-4 times a day to help with spasms. Watch head position when on computers, texting, when sleeping in bed - should in line with back to prevent further nerve traction and irritation. Consider home traction unit if you get benefit with this in physical therapy. If not improving we will consider an MRI. Follow up with me in 4-6 weeks.

## 2013-12-23 ENCOUNTER — Encounter: Payer: Self-pay | Admitting: Family Medicine

## 2013-12-23 DIAGNOSIS — S199XXA Unspecified injury of neck, initial encounter: Secondary | ICD-10-CM | POA: Insufficient documentation

## 2013-12-23 NOTE — Assessment & Plan Note (Signed)
consistent with cervical strain and contusion with possible disc bulge.  Start with ibuprofen regularly, robaxin and percocet as needed.  Already s/p prednisone dose pack without much benefit.  Physical therapy most likely thing to provide him relief - start this and HEP.  Heat as needed.  Ergonomic issues discussed.  Follow up with me in 4-6 weeks.

## 2013-12-23 NOTE — Progress Notes (Signed)
Patient ID: Timothy Daniels, male   DOB: 09/10/1991, 23 y.o.   MRN: [id number]  PCP: No PCP Per Patient  Subjective:   HPI: Patient is a 23 y.o. male here for neck pain.  Patient reports when he was in a high school wrestling match 4 years ago he was thrown down onto bilateral shoulders with neck flexed and pinned in this position. Took a month to feel better from this. Then about a month ago he was cutting a tree and had a large branch hit him across posterior shoulders, lower neck region. Lots of pain. Went to ED and had CT of c-spine on 3/9 that was negative. Given prednisone, pain medication, tried ibuprofen with minimal benefit. Arms feel fatigued. Most of pain on left side. No numbness or tingling. Hard to look upwards. No bowel/bladder dysfunction.  Past Medical History  Diagnosis Date  . Arthritis   . Asthma     seasonal--inhaler prn    No current outpatient prescriptions on file prior to visit.   No current facility-administered medications on file prior to visit.    History reviewed. No pertinent past surgical history.  Allergies  Allergen Reactions  . Ultram [Tramadol]   . Zithromax [Azithromycin]     Unknown    History   Social History  . Marital Status: Single    Spouse Name: N/A    Number of Children: N/A  . Years of Education: N/A   Occupational History  . Not on file.   Social History Main Topics  . Smoking status: Current Every Day Smoker -- 1.00 packs/day    Types: Cigarettes  . Smokeless tobacco: Not on file  . Alcohol Use: No  . Drug Use: No  . Sexual Activity: Not on file   Other Topics Concern  . Not on file   Social History Narrative  . No narrative on file    History reviewed. No pertinent family history.  BP 142/87  Pulse 103  Ht 5\' 10"  (1.778 m)  Wt 135 lb (61.236 kg)  BMI 19.37 kg/m2  Review of Systems: See HPI above.    Objective:  Physical Exam:  Gen: NAD  Neck: No gross deformity, swelling, bruising. TTP  left cervical paraspinal region, trapezius.  No midline/bony TTP. Only 5 degrees extension;  20 degrees left lateral rotation, 40 degrees to right.  Full flexion.  Only right lateral rotation not painful. BUE strength 5/5.   Sensation intact to light touch.   2+ equal reflexes in triceps, biceps, brachioradialis tendons. Negative spurlings. NV intact distal BUEs.    Assessment & Plan:  1. Neck injury - consistent with cervical strain and contusion with possible disc bulge.  Start with ibuprofen regularly, robaxin and percocet as needed.  Already s/p prednisone dose pack without much benefit.  Physical therapy most likely thing to provide him relief - start this and HEP.  Heat as needed.  Ergonomic issues discussed.  Follow up with me in 4-6 weeks.

## 2014-01-26 ENCOUNTER — Ambulatory Visit: Payer: BC Managed Care – PPO | Admitting: Family Medicine
